# Patient Record
Sex: Male | Born: 1970 | Race: Black or African American | Hispanic: No | Marital: Single | Smoking: Current every day smoker
Health system: Southern US, Community
[De-identification: ages and names within clinical notes are randomized; demographics above are authoritative.]

---

## 2019-07-05 ENCOUNTER — Emergency Department (HOSPITAL_COMMUNITY): Payer: BC Managed Care – PPO

## 2019-07-05 ENCOUNTER — Emergency Department (HOSPITAL_COMMUNITY)
Admission: EM | Admit: 2019-07-05 | Discharge: 2019-07-05 | Disposition: A | Discharge status: Home / Self Care | Payer: BC Managed Care – PPO | Attending: Emergency Medicine | Admitting: Emergency Medicine

## 2019-07-05 ENCOUNTER — Encounter (HOSPITAL_COMMUNITY): Payer: Self-pay | Admitting: Emergency Medicine

## 2019-07-05 ENCOUNTER — Other Ambulatory Visit: Payer: Self-pay

## 2019-07-05 DIAGNOSIS — I10 Essential (primary) hypertension: Secondary | ICD-10-CM | POA: Diagnosis not present

## 2019-07-05 DIAGNOSIS — N201 Calculus of ureter: Secondary | ICD-10-CM | POA: Diagnosis not present

## 2019-07-05 DIAGNOSIS — N132 Hydronephrosis with renal and ureteral calculous obstruction: Secondary | ICD-10-CM | POA: Diagnosis not present

## 2019-07-05 DIAGNOSIS — R109 Unspecified abdominal pain: Secondary | ICD-10-CM | POA: Diagnosis not present

## 2019-07-05 DIAGNOSIS — N179 Acute kidney failure, unspecified: Secondary | ICD-10-CM | POA: Insufficient documentation

## 2019-07-05 DIAGNOSIS — F17210 Nicotine dependence, cigarettes, uncomplicated: Secondary | ICD-10-CM | POA: Insufficient documentation

## 2019-07-05 LAB — CBC WITH DIFFERENTIAL/PLATELET
Abs Immature Granulocytes: 0.02 10*3/uL (ref 0.00–0.07)
Basophils Absolute: 0.1 10*3/uL (ref 0.0–0.1)
Basophils Relative: 1 %
Eosinophils Absolute: 0.1 10*3/uL (ref 0.0–0.5)
Eosinophils Relative: 1 %
HCT: 52.3 % — ABNORMAL HIGH (ref 39.0–52.0)
Hemoglobin: 17.1 g/dL — ABNORMAL HIGH (ref 13.0–17.0)
Immature Granulocytes: 0 %
Lymphocytes Relative: 16 %
Lymphs Abs: 1.4 10*3/uL (ref 0.7–4.0)
MCH: 29.6 pg (ref 26.0–34.0)
MCHC: 32.7 g/dL (ref 30.0–36.0)
MCV: 90.5 fL (ref 80.0–100.0)
Monocytes Absolute: 1 10*3/uL (ref 0.1–1.0)
Monocytes Relative: 12 %
Neutro Abs: 6.2 10*3/uL (ref 1.7–7.7)
Neutrophils Relative %: 70 %
Platelets: 188 10*3/uL (ref 150–400)
RBC: 5.78 MIL/uL (ref 4.22–5.81)
RDW: 13.8 % (ref 11.5–15.5)
WBC: 8.8 10*3/uL (ref 4.0–10.5)
nRBC: 0 % (ref 0.0–0.2)

## 2019-07-05 LAB — COMPREHENSIVE METABOLIC PANEL
ALT: 12 U/L (ref 0–44)
AST: 24 U/L (ref 15–41)
Albumin: 4.1 g/dL (ref 3.5–5.0)
Alkaline Phosphatase: 96 U/L (ref 38–126)
Anion gap: 8 (ref 5–15)
BUN: 13 mg/dL (ref 6–20)
CO2: 25 mmol/L (ref 22–32)
Calcium: 8.8 mg/dL — ABNORMAL LOW (ref 8.9–10.3)
Chloride: 105 mmol/L (ref 98–111)
Creatinine, Ser: 1.38 mg/dL — ABNORMAL HIGH (ref 0.61–1.24)
GFR calc Af Amer: >60 mL/min (ref >60)
GFR calc non Af Amer: >60 mL/min (ref >60)
Glucose, Bld: 102 mg/dL — ABNORMAL HIGH (ref 70–99)
Potassium: 4 mmol/L (ref 3.5–5.1)
Sodium: 138 mmol/L (ref 135–145)
Total Bilirubin: 0.5 mg/dL (ref 0.3–1.2)
Total Protein: 7.4 g/dL (ref 6.5–8.1)

## 2019-07-05 LAB — URINALYSIS, ROUTINE W REFLEX MICROSCOPIC
Bilirubin Urine: NEGATIVE
Glucose, UA: NEGATIVE mg/dL
Ketones, ur: NEGATIVE mg/dL
Leukocytes,Ua: NEGATIVE
Nitrite: NEGATIVE
Protein, ur: NEGATIVE mg/dL
Specific Gravity, Urine: 1.014 (ref 1.005–1.030)
pH: 6 (ref 5.0–8.0)

## 2019-07-05 MED ORDER — ONDANSETRON HCL 4 MG/2ML IJ SOLN
4.0000 mg | Freq: Once | INTRAMUSCULAR | Status: AC
  Administered 2019-07-05: 08:00:00 4 mg via INTRAVENOUS
  Filled 2019-07-05: qty 2

## 2019-07-05 MED ORDER — OXYCODONE-ACETAMINOPHEN 5-325 MG PO TABS: 2.0000 | ORAL_TABLET | ORAL | 0 refills | Status: AC | PRN

## 2019-07-05 MED ORDER — ONDANSETRON 4 MG PO TBDP: 4.0000 mg | ORAL_TABLET | Freq: Three times a day (TID) | ORAL | 0 refills | Status: DC | PRN

## 2019-07-05 MED ORDER — TAMSULOSIN HCL 0.4 MG PO CAPS: 0.4000 mg | ORAL_CAPSULE | Freq: Two times a day (BID) | ORAL | 0 refills | Status: DC

## 2019-07-05 MED ORDER — SODIUM CHLORIDE 0.9 % IV BOLUS
1000.0000 mL | Freq: Once | INTRAVENOUS | Status: AC
  Administered 2019-07-05: 10:00:00 1000 mL via INTRAVENOUS

## 2019-07-05 MED ORDER — KETOROLAC TROMETHAMINE 30 MG/ML IJ SOLN
15.0000 mg | Freq: Once | INTRAMUSCULAR | Status: AC
  Administered 2019-07-05: 15 mg via INTRAVENOUS
  Filled 2019-07-05: qty 1

## 2019-07-05 NOTE — ED Triage Notes (Signed)
Pt c/o right flank pain with frequent urination since around midnight.

## 2019-07-05 NOTE — ED Provider Notes (Signed)
[facility] DEPT Provider Note   CSN: [id number] Arrival date & time: 07/05/19  3557     History   Chief Complaint Chief Complaint  Patient presents with  . Flank Pain    HPI Patrick Fields is a 48 y.o. male.     48 year old male who presents with right flank pain.  Around midnight last night, he had a sudden onset of sharp, intermittent, severe right flank pain associated with frequent urination.  He denies any vomiting, hematuria, diarrhea, fevers, cough/cold symptoms, or recent illness.  He has never had this before and denies any history of kidney problems.  No medications prior to arrival.  He did drink a glass of apple cider vinegar which did not improve his symptoms.  The history is provided by the patient.  Flank Pain    History reviewed. No pertinent past medical history.  There are no active problems to display for this patient.   History reviewed. No pertinent surgical history.      Home Medications    Prior to Admission medications   Medication Sig Start Date End Date Taking? Authorizing Provider  polyvinyl alcohol (LIQUIFILM TEARS) 1.4 % ophthalmic solution Place 1 drop into both eyes as needed for dry eyes (red eyes).   Yes [provider]  sodium chloride (OCEAN) 0.65 % SOLN nasal spray Place 1 spray into both nostrils as needed for congestion.   Yes [provider]  ondansetron (ZOFRAN ODT) 4 MG disintegrating tablet Take 1 tablet (4 mg total) by mouth every 8 (eight) hours as needed for nausea or vomiting. 07/05/19   Leomar Westberg, Wenda Overland, MD  oxyCODONE-acetaminophen (PERCOCET) 5-325 MG tablet Take 2 tablets by mouth every 4 (four) hours as needed for up to 3 days for severe pain. 07/05/19 07/08/19  Ilyas Lipsitz, Wenda Overland, MD  tamsulosin (FLOMAX) 0.4 MG CAPS capsule Take 1 capsule (0.4 mg total) by mouth 2 (two) times daily. 07/05/19   Vita Currin, Wenda Overland, MD    Family History No family history on file.   Social History Social History   Tobacco Use  . Smoking status: Current Every Day Smoker    Types: Cigarettes  . Smokeless tobacco: Never Used  Substance Use Topics  . Alcohol use: Yes  . Drug use: Not on file     Allergies   Patient has no known allergies.   Review of Systems Review of Systems  Genitourinary: Positive for flank pain.   All other systems reviewed and are negative except that which was mentioned in HPI   Physical Exam Updated Vital Signs BP (!) 183/131 (BP Location: Left Arm)   Pulse 75   Temp 98.8 F (37.1 C) (Oral)   Resp 19   SpO2 98%   Physical Exam Vitals signs and nursing note reviewed.  Constitutional:      Appearance: He is well-developed.     Comments: uncomfortable  HENT:     Head: Normocephalic and atraumatic.  Eyes:     Conjunctiva/sclera: Conjunctivae normal.     Pupils: Pupils are equal, round, and reactive to light.  Neck:     Musculoskeletal: Neck supple.  Cardiovascular:     Rate and Rhythm: Normal rate and regular rhythm.     Heart sounds: Normal heart sounds. No murmur.  Pulmonary:     Effort: Pulmonary effort is normal.     Breath sounds: Normal breath sounds.  Abdominal:     General: Bowel sounds are normal. There is no distension.  Palpations: Abdomen is soft.     Tenderness: There is no abdominal tenderness.  Genitourinary:    Comments: R CVA tenderness Skin:    General: Skin is warm and dry.  Neurological:     Mental Status: He is alert and oriented to person, place, and time.     Comments: Fluent speech  Psychiatric:        Judgment: Judgment normal.      ED Treatments / Results  Labs (all labs ordered are listed, but only abnormal results are displayed) Labs Reviewed  URINALYSIS, ROUTINE W REFLEX MICROSCOPIC - Abnormal; Notable for the following components:      Result Value   Hgb urine dipstick SMALL (*)    Bacteria, UA RARE (*)    All other components within normal limits  COMPREHENSIVE  METABOLIC PANEL - Abnormal; Notable for the following components:   Glucose, Bld 102 (*)    Creatinine, Ser 1.38 (*)    Calcium 8.8 (*)    All other components within normal limits  CBC WITH DIFFERENTIAL/PLATELET - Abnormal; Notable for the following components:   Hemoglobin 17.1 (*)    HCT 52.3 (*)    All other components within normal limits    EKG None  Radiology Ct Renal Stone Study  Result Date: 07/05/2019 CLINICAL DATA:  RIGHT flank pain with frequent urination since midnight. EXAM: CT ABDOMEN AND PELVIS WITHOUT CONTRAST TECHNIQUE: Multidetector CT imaging of the abdomen and pelvis was performed following the standard protocol without IV contrast. COMPARISON:  None. FINDINGS: Lower chest: No acute abnormality. Hepatobiliary: No focal liver abnormality is seen. No gallstones, gallbladder wall thickening, or biliary dilatation. Pancreas: Unremarkable. No pancreatic ductal dilatation or surrounding inflammatory changes. Spleen: Normal in size without focal abnormality. Adrenals/Urinary Tract: 4 mm stone at the RIGHT UVJ causing moderate RIGHT-sided hydronephrosis and perinephric inflammation. No LEFT renal stone or hydronephrosis. No LEFT-sided ureteral stone. Bladder is partially decompressed. Adrenal glands are unremarkable. Stomach/Bowel: No dilated large or small bowel loops. No evidence of bowel wall inflammation. Appendix is normal. Stomach is unremarkable. Vascular/Lymphatic: Aortic atherosclerosis. No enlarged lymph nodes seen. Reproductive: Prostate is unremarkable. Other: No free fluid or abscess collection. No free intraperitoneal air. Musculoskeletal: No acute or suspicious osseous finding. IMPRESSION: 1. 4 mm stone at the RIGHT UVJ causing moderate RIGHT-sided hydronephrosis and perinephric inflammation. 2. Aortic atherosclerosis. Aortic Atherosclerosis (ICD10-I70.0). Electronically Signed   By: Bary Richard M.D.   On: 07/05/2019 09:28    Procedures Procedures (including critical  care time)  Medications Ordered in ED Medications  ondansetron (ZOFRAN) injection 4 mg (4 mg Intravenous Given 07/05/19 0817)  ketorolac (TORADOL) 30 MG/ML injection 15 mg (15 mg Intravenous Given 07/05/19 0817)  sodium chloride 0.9 % bolus 1,000 mL (1,000 mLs Intravenous New Bag/Given 07/05/19 0932)     Initial Impression / Assessment and Plan / ED Course  I have reviewed the triage vital signs and the nursing notes.  Pertinent labs & imaging results that were available during my care of the patient were reviewed by me and considered in my medical decision making (see chart for details).        BP 183/131, denies h/o HTN, may be 2/2 pain. No abd tenderness on exam. UA without infection or significant hematuria.  Creatinine mildly elevated at 1.3.  Obtained CT renal study which confirms 4 mm stone at right UVJ with some moderate hydronephrosis.  Given distal location of stone, I suspect he will pass stone with symptom control.  He felt  much better on reassessment.  I discussed his mild elevation of creatinine and elevated blood pressure and discussed the possibility of his hypertension contributing to AKI.  He then admitted that he had used cocaine last night.  I counseled patient against substance use and recommended that he establish care with a PCP to have blood pressure rechecked as he may need antihypertensives.  Regarding his kidney stone, discussed abortive measures at home and urology follow-up.  I have extensively reviewed return precautions and he voiced understanding.  Final Clinical Impressions(s) / ED Diagnoses   Final diagnoses:  Right ureteral stone  AKI (acute kidney injury) [facility](HCC)    ED Discharge Orders         Ordered    oxyCODONE-acetaminophen (PERCOCET) 5-325 MG tablet  Every 4 hours PRN     07/05/19 0959    ondansetron (ZOFRAN ODT) 4 MG disintegrating tablet  Every 8 hours PRN     07/05/19 0959    tamsulosin (FLOMAX) 0.4 MG CAPS capsule  2 times daily     07/05/19  0959           Cj Edgell, Ambrose Finlandachel Morgan, MD 07/05/19 1003

## 2021-02-06 DIAGNOSIS — I10 Essential (primary) hypertension: Secondary | ICD-10-CM | POA: Diagnosis not present

## 2021-02-06 DIAGNOSIS — G4700 Insomnia, unspecified: Secondary | ICD-10-CM | POA: Diagnosis not present

## 2021-02-06 DIAGNOSIS — Z7189 Other specified counseling: Secondary | ICD-10-CM | POA: Diagnosis not present

## 2021-02-06 DIAGNOSIS — Z2821 Immunization not carried out because of patient refusal: Secondary | ICD-10-CM | POA: Diagnosis not present

## 2021-02-06 DIAGNOSIS — F1990 Other psychoactive substance use, unspecified, uncomplicated: Secondary | ICD-10-CM | POA: Diagnosis not present

## 2021-02-06 DIAGNOSIS — N529 Male erectile dysfunction, unspecified: Secondary | ICD-10-CM | POA: Diagnosis not present

## 2021-02-15 DIAGNOSIS — G4700 Insomnia, unspecified: Secondary | ICD-10-CM | POA: Diagnosis not present

## 2021-02-15 DIAGNOSIS — I10 Essential (primary) hypertension: Secondary | ICD-10-CM | POA: Diagnosis not present

## 2021-02-15 DIAGNOSIS — F1990 Other psychoactive substance use, unspecified, uncomplicated: Secondary | ICD-10-CM | POA: Diagnosis not present

## 2021-03-12 IMAGING — CT CT RENAL STONE PROTOCOL
2 of 4 series · 16 of 46 positions shown, 18 images · non-contrast
Comparison: None.

CLINICAL DATA: RIGHT flank pain with frequent urination since
midnight.

EXAM:
CT ABDOMEN AND PELVIS WITHOUT CONTRAST
TECHNIQUE: Multidetector CT imaging of the abdomen and pelvis was performed
following the standard protocol without IV contrast.

[Series 2: axial st · axial · 0.93mm/px · z∈[-700,-225]mm · 13 of 107 slices shown, 15 images]
[im 6/107  soft-tissue]
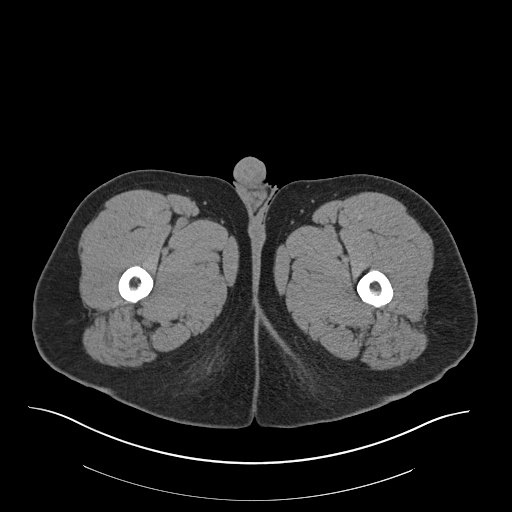
[im 6/107  bone]
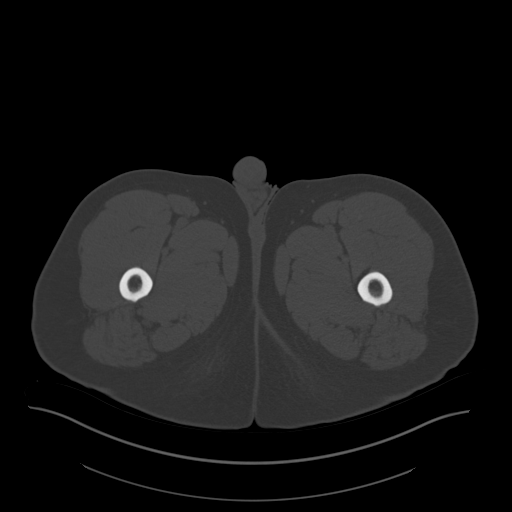
[im 16/107  soft-tissue]
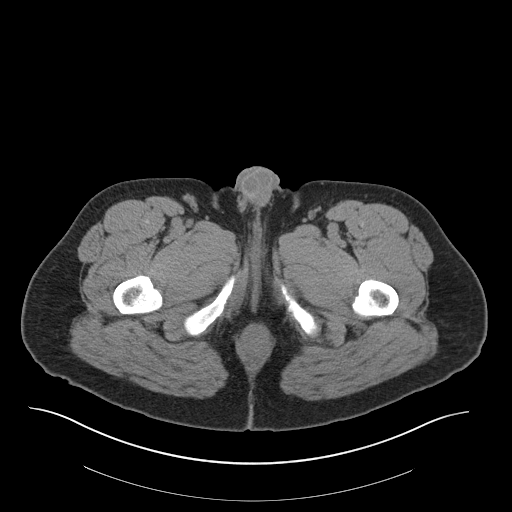
[im 22/107  soft-tissue]
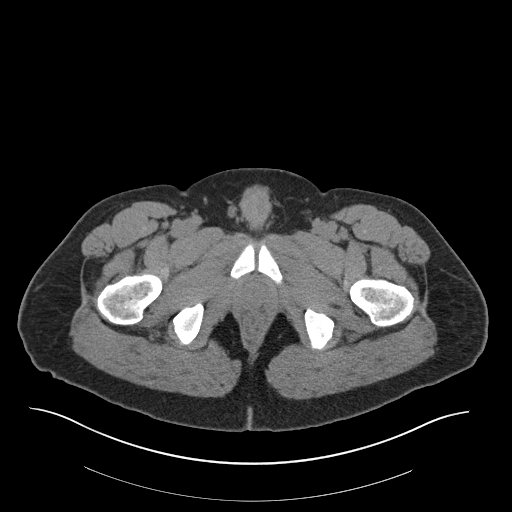
[im 32/107  soft-tissue]
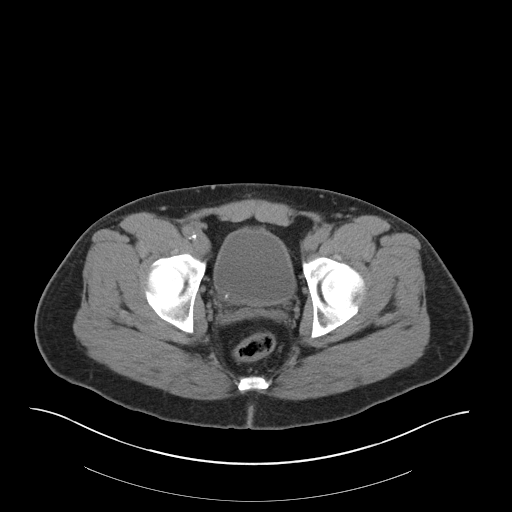
[im 38/107  soft-tissue]
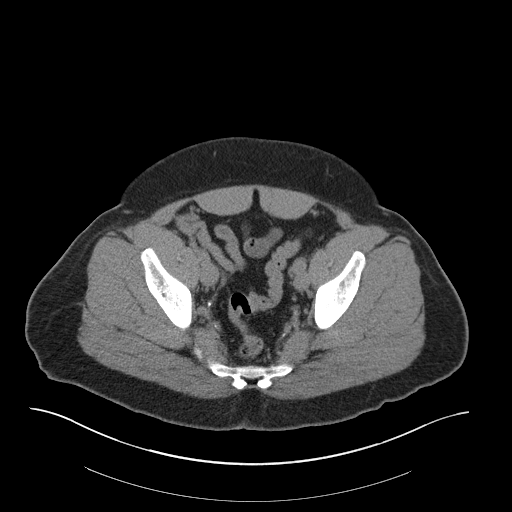
[im 48/107  soft-tissue]
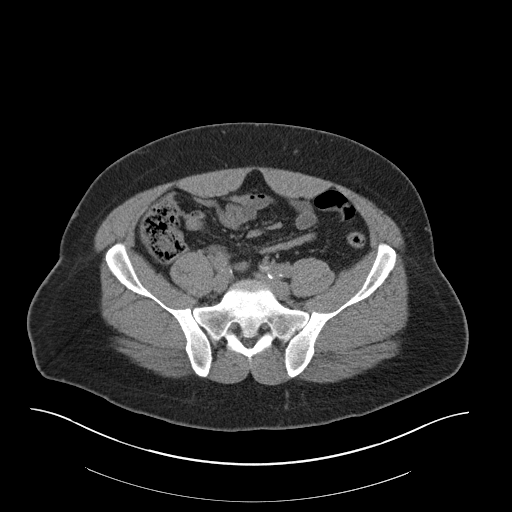
[im 54/107  soft-tissue]
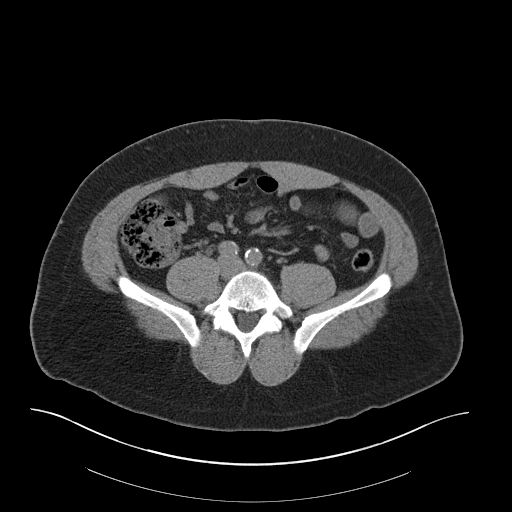
[im 59/107  soft-tissue]
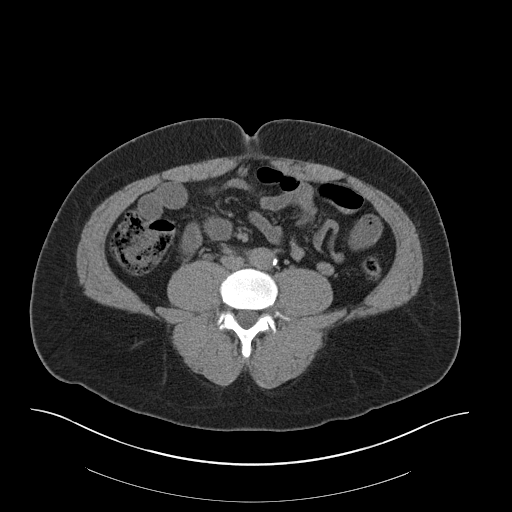
[im 69/107  soft-tissue]
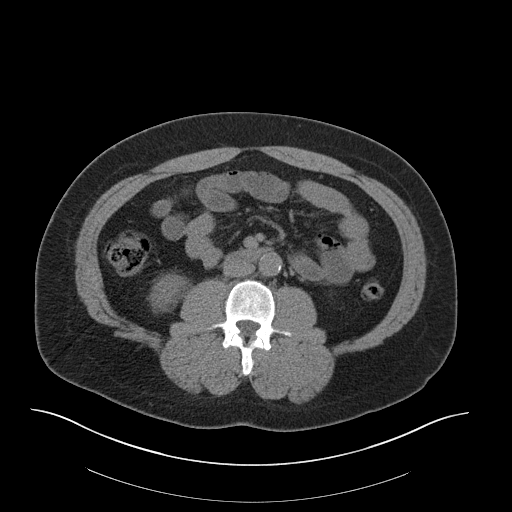
[im 69/107  bone]
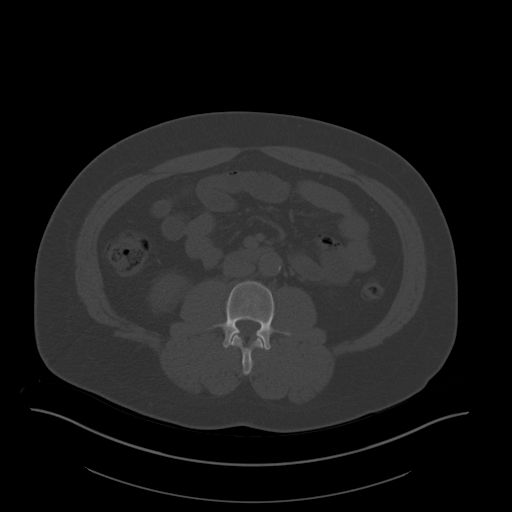
[im 75/107  soft-tissue]
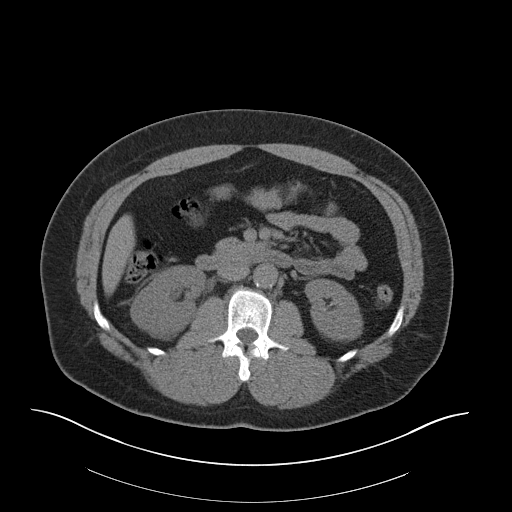
[im 85/107  soft-tissue]
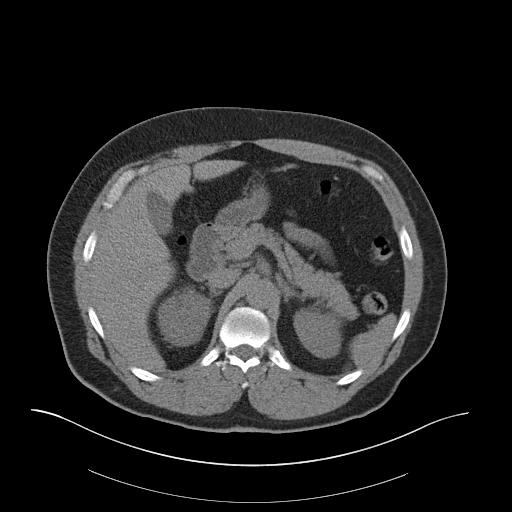
[im 91/107  soft-tissue]
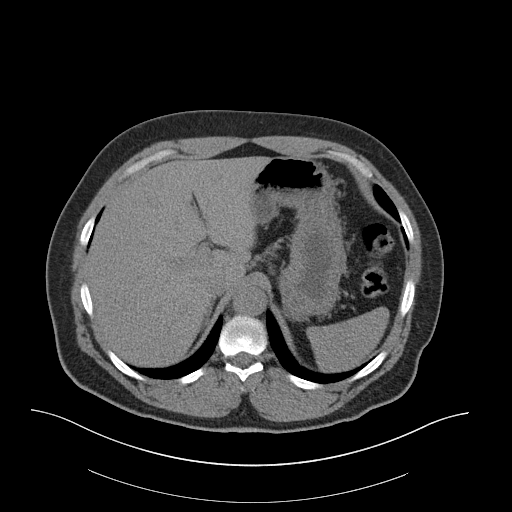
[im 101/107  soft-tissue]
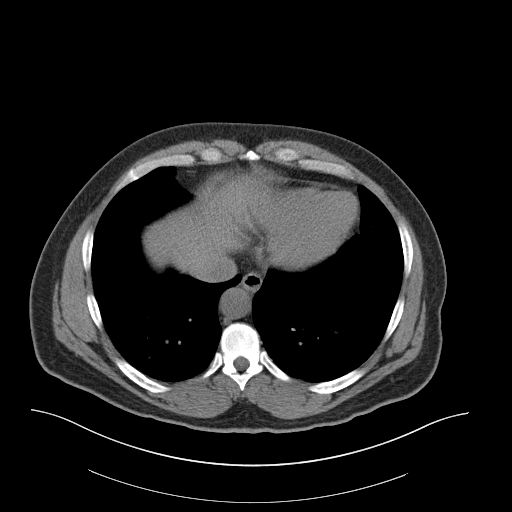

[Series 4: coronal · coronal · 0.80mm/px · 3 of 156 slices shown]
[im 52/156  soft-tissue]
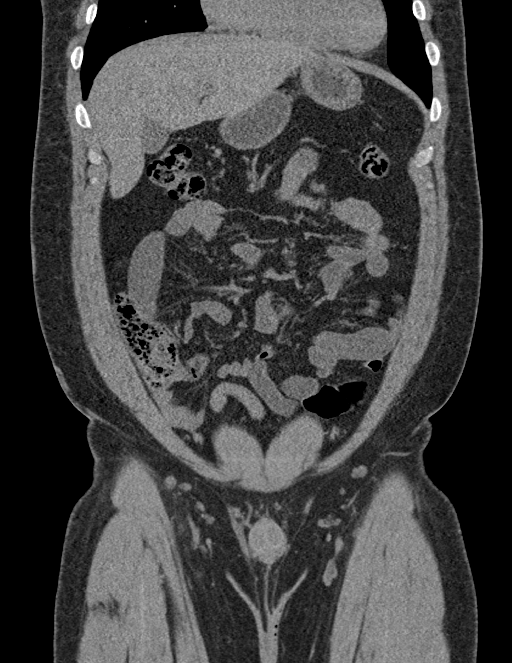
[im 69/156  soft-tissue]
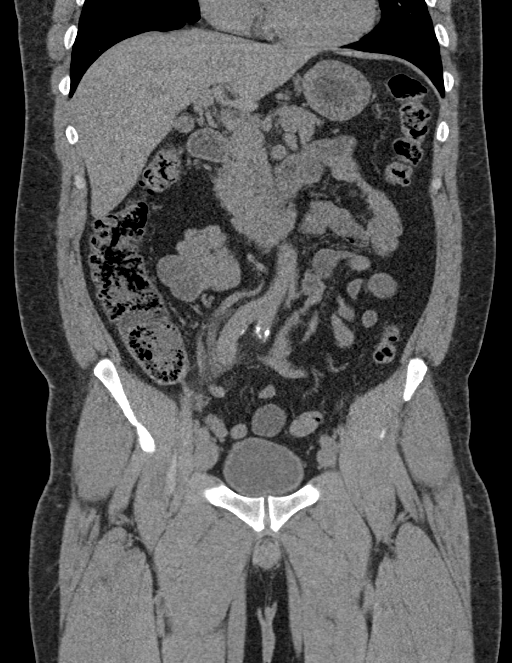
[im 87/156  soft-tissue]
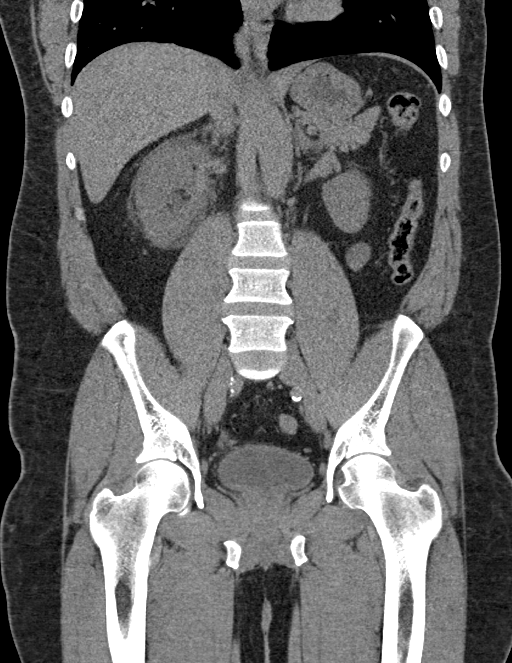

[16 of 46 positions shown; findings below may reference images not displayed]

FINDINGS: Lower chest: No acute abnormality.

Hepatobiliary: No focal liver abnormality is seen. No gallstones,
gallbladder wall thickening, or biliary dilatation.

Pancreas: Unremarkable. No pancreatic ductal dilatation or
surrounding inflammatory changes.

Spleen: Normal in size without focal abnormality.

Adrenals/Urinary Tract: 4 mm stone at the RIGHT UVJ causing moderate
RIGHT-sided hydronephrosis and perinephric inflammation.

No LEFT renal stone or hydronephrosis. No LEFT-sided ureteral stone.
Bladder is partially decompressed. Adrenal glands are unremarkable.

Stomach/Bowel: No dilated large or small bowel loops. No evidence of
bowel wall inflammation. Appendix is normal. Stomach is
unremarkable.

Vascular/Lymphatic: Aortic atherosclerosis. No enlarged lymph nodes
seen.

Reproductive: Prostate is unremarkable.

Other: No free fluid or abscess collection. No free intraperitoneal
air.

Musculoskeletal: No acute or suspicious osseous finding.
IMPRESSION: 1. 4 mm stone at the RIGHT UVJ causing moderate RIGHT-sided
hydronephrosis and perinephric inflammation.
2. Aortic atherosclerosis.

Aortic Atherosclerosis ([id number]).

## 2021-11-06 DIAGNOSIS — I10 Essential (primary) hypertension: Secondary | ICD-10-CM | POA: Diagnosis not present

## 2021-11-06 DIAGNOSIS — Z720 Tobacco use: Secondary | ICD-10-CM | POA: Diagnosis not present

## 2021-11-06 DIAGNOSIS — U071 COVID-19: Secondary | ICD-10-CM | POA: Diagnosis not present

## 2022-11-11 ENCOUNTER — Telehealth: Payer: BC Managed Care – PPO | Admitting: Family

## 2022-11-11 DIAGNOSIS — J029 Acute pharyngitis, unspecified: Secondary | ICD-10-CM | POA: Diagnosis not present

## 2022-11-11 MED ORDER — AMOXICILLIN 500 MG PO CAPS: 500.0000 mg | ORAL_CAPSULE | Freq: Two times a day (BID) | ORAL | 0 refills | Status: AC

## 2022-11-11 NOTE — Progress Notes (Signed)
Virtual Visit Consent   Patrick Fields, you are scheduled for a virtual visit with a [facility] provider today. Just as with appointments in the office, your consent must be obtained to participate. Your consent will be active for this visit and any virtual visit you may have with one of our providers in the next 365 days. If you have a MyChart account, a copy of this consent can be sent to you electronically.  As this is a virtual visit, video technology does not allow for your provider to perform a traditional examination. This may limit your provider's ability to fully assess your condition. If your provider identifies any concerns that need to be evaluated in person or the need to arrange testing (such as labs, EKG, etc.), we will make arrangements to do so. Although advances in technology are sophisticated, we cannot ensure that it will always work on either your end or our end. If the connection with a video visit is poor, the visit may have to be switched to a telephone visit. With either a video or telephone visit, we are not always able to ensure that we have a secure connection.  By engaging in this virtual visit, you consent to the provision of healthcare and authorize for your insurance to be billed (if applicable) for the services provided during this visit. Depending on your insurance coverage, you may receive a charge related to this service.  I need to obtain your verbal consent now. Are you willing to proceed with your visit today? Patrick Fields has provided verbal consent on 11/11/2022 for a virtual visit (video or telephone). Evelina Dun, FNP  Date: 11/11/2022 5:12 PM  Virtual Visit via Video Note   I, Evelina Dun, connected with  Patrick Fields  ([id number], 20-Dec-1970) on 11/11/22 at  5:30 PM EST by a video-enabled telemedicine application and verified that I am speaking with the correct person using two identifiers.  Location: Patient: Virtual Visit Location Patient:  Home Provider: Virtual Visit Location Provider: Home Office   I discussed the limitations of evaluation and management by telemedicine and the availability of in person appointments. The patient expressed understanding and agreed to proceed.    History of Present Illness: Patrick Fields is a 52 y.o. who identifies as a male who was assigned male at birth, and is being seen today for sore throat and swollen lymph nodes on right neck.  HPI: Sore Throat  This is a new problem. The current episode started today. The problem has been gradually worsening. The pain is worse on the right side. The pain is at a severity of 10/10. The pain is moderate. Associated symptoms include ear pain, headaches, a plugged ear sensation, swollen glands and trouble swallowing. Pertinent negatives include no congestion, coughing, ear discharge or shortness of breath. He has tried NSAIDs and acetaminophen for the symptoms. The treatment provided mild relief.    Problems: There are no problems to display for this patient.   Allergies: No Known Allergies Medications:  Current Outpatient Medications:    amoxicillin (AMOXIL) 500 MG capsule, Take 1 capsule (500 mg total) by mouth 2 (two) times daily for 10 days., Disp: 20 capsule, Rfl: 0  Observations/Objective: Patient is well-developed, well-nourished in no acute distress.  Resting comfortably  at home.  Head is normocephalic, atraumatic.  No labored breathing.  Speech is clear and coherent with logical content.  Patient is alert and oriented at baseline.  Right cervical lymph nodes swollen, tonsils erythemas, having hard time opening mouth  wide  Assessment and Plan: 1. Acute pharyngitis, unspecified etiology - amoxicillin (AMOXIL) 500 MG capsule; Take 1 capsule (500 mg total) by mouth 2 (two) times daily for 10 days.  Dispense: 20 capsule; Refill: 0  - Take meds as prescribed - Use a cool mist humidifier  -Use saline nose sprays frequently -Force fluids -For  any cough or congestion  Use plain Mucinex- regular strength or max strength is fine -For fever or aces or pains- take tylenol or ibuprofen. -Throat lozenges if help -New toothbrush in 3 days -Follow up if symptoms worsen or do not improve   Follow Up Instructions: I discussed the assessment and treatment plan with the patient. The patient was provided an opportunity to ask questions and all were answered. The patient agreed with the plan and demonstrated an understanding of the instructions.  A copy of instructions were sent to the patient via MyChart unless otherwise noted below.   Patient has requested to receive PHI (AVS, Work Notes, etc) pertaining to this video visit through e-mail as they are currently without active MyChart. They have voiced understand that email is not considered secure and their health information could be viewed by someone other than the patient.   The patient was advised to call back or seek an in-person evaluation if the symptoms worsen or if the condition fails to improve as anticipated.  Time:  I spent 9 minutes with the patient via telehealth technology discussing the above problems/concerns.    Jannifer Rodney, FNP

## 2024-06-21 ENCOUNTER — Other Ambulatory Visit: Payer: Self-pay | Admitting: Medical Genetics

## 2024-07-06 DIAGNOSIS — Z125 Encounter for screening for malignant neoplasm of prostate: Secondary | ICD-10-CM | POA: Diagnosis not present

## 2024-07-06 DIAGNOSIS — Z0000 Encounter for general adult medical examination without abnormal findings: Secondary | ICD-10-CM | POA: Diagnosis not present

## 2024-07-06 DIAGNOSIS — Z13220 Encounter for screening for lipoid disorders: Secondary | ICD-10-CM | POA: Diagnosis not present

## 2024-07-14 ENCOUNTER — Other Ambulatory Visit: Payer: Self-pay

## 2024-07-14 DIAGNOSIS — Z006 Encounter for examination for normal comparison and control in clinical research program: Secondary | ICD-10-CM

## 2024-07-22 LAB — GENECONNECT MOLECULAR SCREEN: Genetic Analysis Overall Interpretation: NEGATIVE

## 2024-08-02 DIAGNOSIS — F1410 Cocaine abuse, uncomplicated: Secondary | ICD-10-CM | POA: Diagnosis not present

## 2024-08-02 DIAGNOSIS — Z7289 Other problems related to lifestyle: Secondary | ICD-10-CM | POA: Diagnosis not present

## 2024-08-02 DIAGNOSIS — I10 Essential (primary) hypertension: Secondary | ICD-10-CM | POA: Diagnosis not present

## 2024-08-02 DIAGNOSIS — E669 Obesity, unspecified: Secondary | ICD-10-CM | POA: Diagnosis not present
